# Patient Record
Sex: Female | Born: 1975 | Race: White | Hispanic: No | Marital: Single | Smoking: Current every day smoker
Health system: Southern US, Community
[De-identification: ages and names within clinical notes are randomized; demographics above are authoritative.]

## PROBLEM LIST (undated history)

## (undated) HISTORY — PX: TONSILLECTOMY: SUR1361

---

## 2000-06-14 ENCOUNTER — Emergency Department (HOSPITAL_COMMUNITY): Admission: EM | Admit: 2000-06-14 | Discharge: 2000-06-14 | Payer: Self-pay | Admitting: Internal Medicine

## 2008-11-08 ENCOUNTER — Ambulatory Visit: Payer: Self-pay

## 2010-06-14 ENCOUNTER — Ambulatory Visit: Payer: Self-pay | Admitting: Family

## 2010-06-27 ENCOUNTER — Ambulatory Visit: Payer: Self-pay | Admitting: Family

## 2010-08-18 IMAGING — CR DG LUMBAR SPINE AP/LAT/OBLIQUES W/ FLEX AND EXT
1 series · 5 of 5 positions shown · non-contrast
Comparison: none

REASON FOR EXAM: back pain Dr. Yerovi Bosiga fax [phone]
COMMENTS:

[Series 1: view not recorded · 0.17mm/px · 5 of 5 slices shown]
[im 1/5]
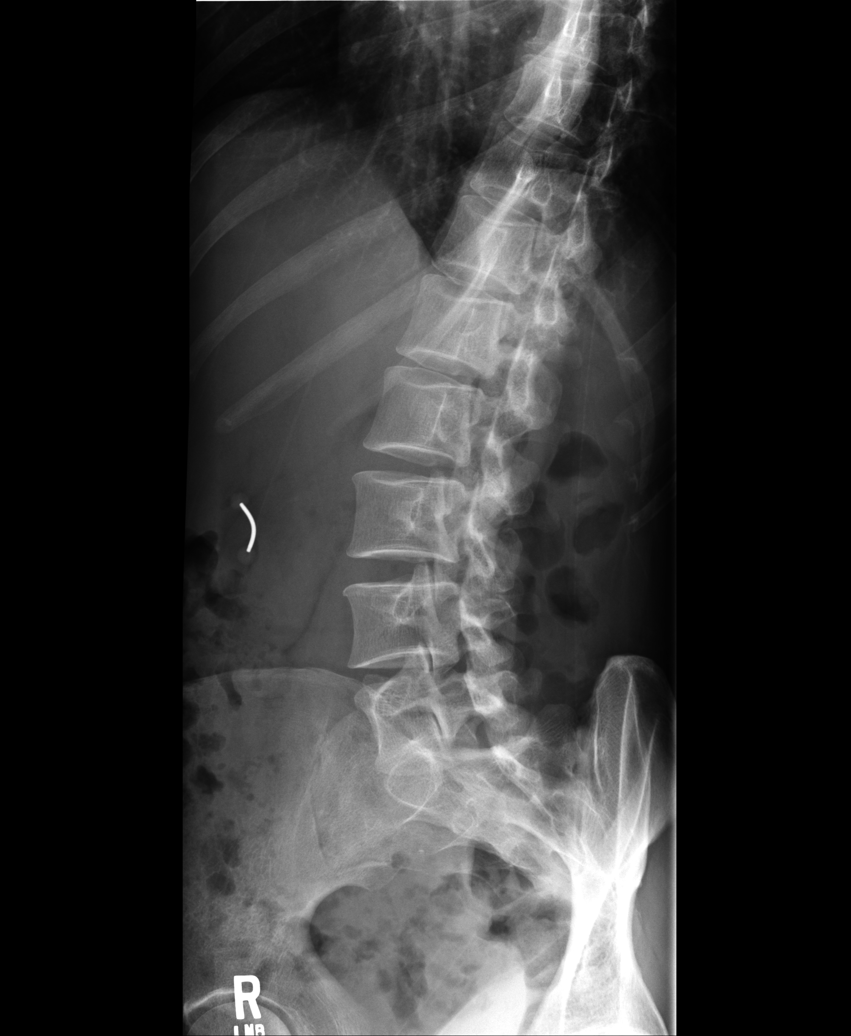
[im 2/5]
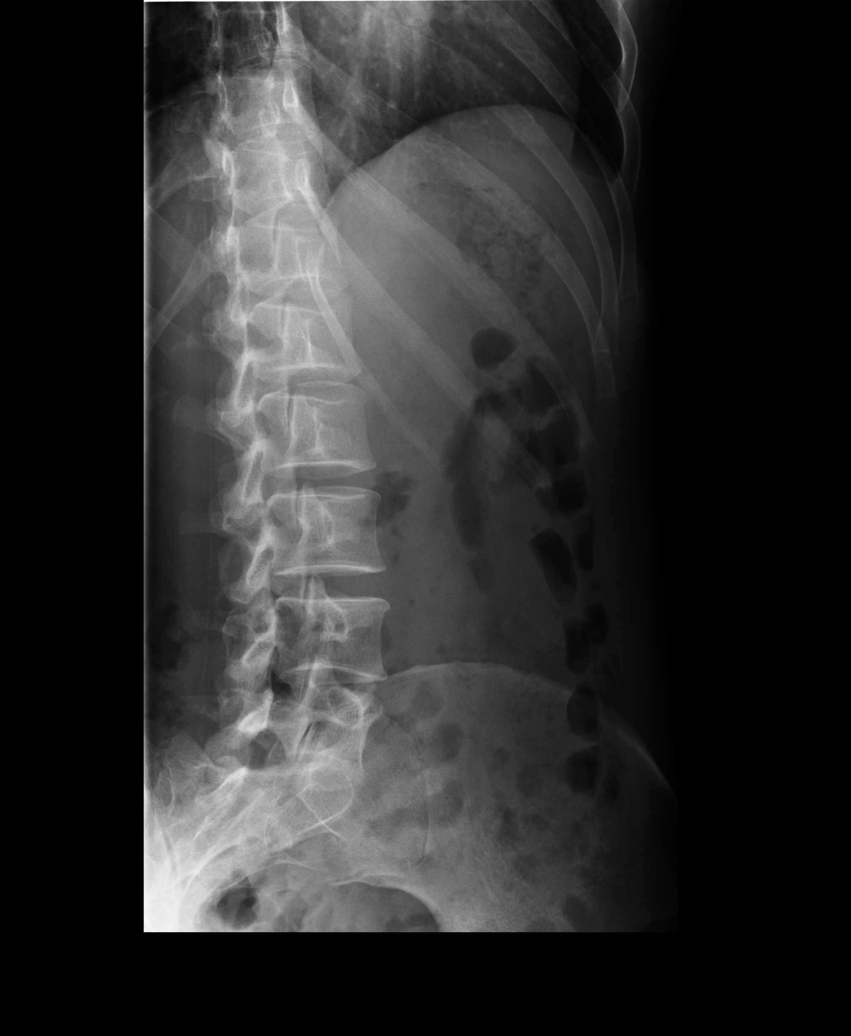
[im 3/5]
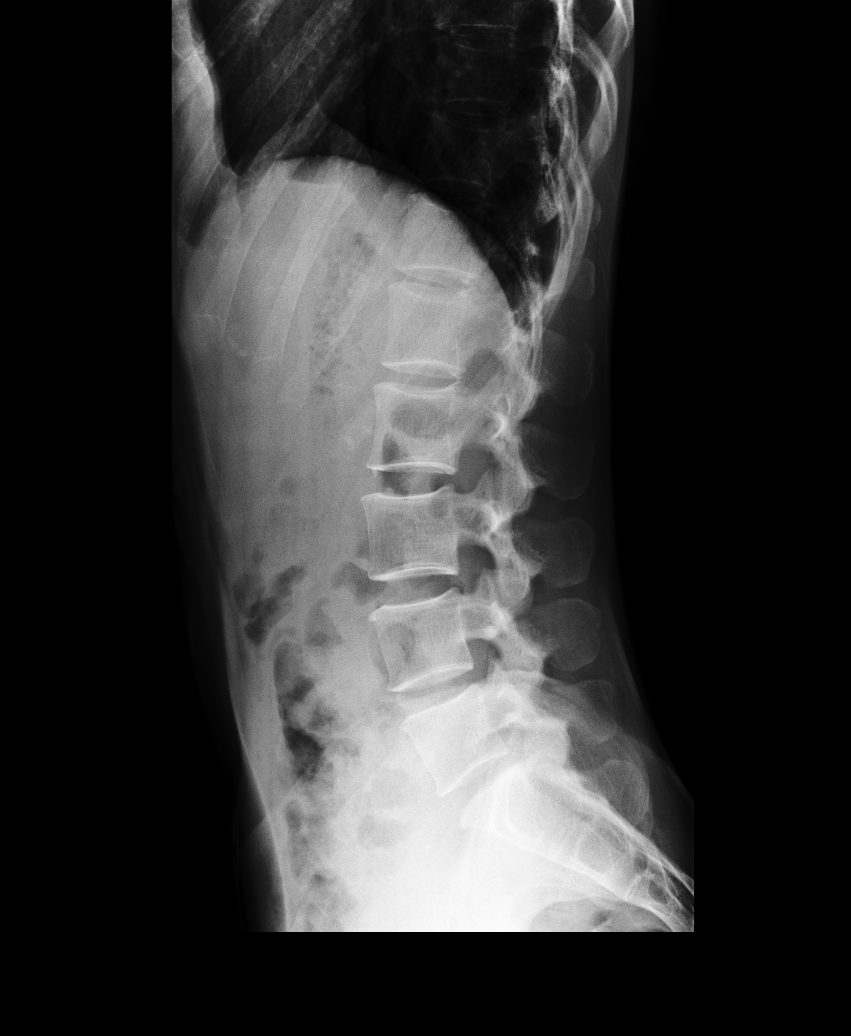
[im 4/5]
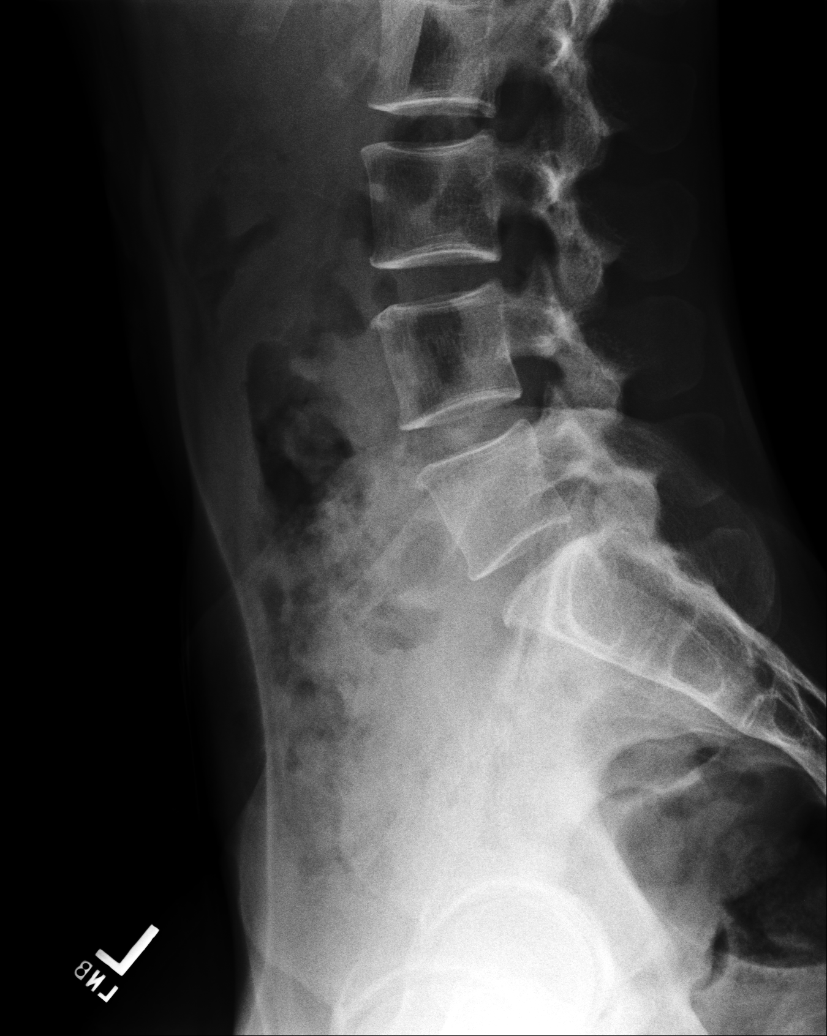
[im 5/5]
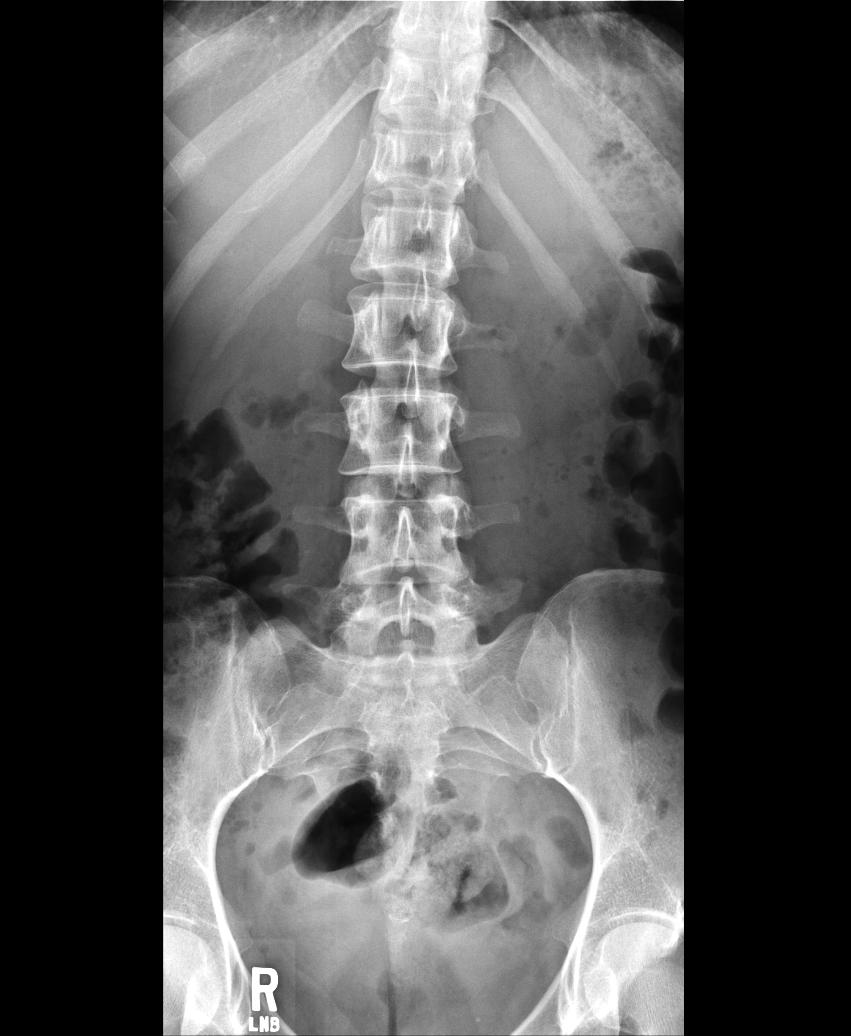

[5 of 5 positions shown; findings below may reference images not displayed]

PROCEDURE:     DXR - DXR LUMBAR SPINE WITH OBLIQUES  - November 08, 2008  [DATE]

RESULT:     The lumbar vertebral bodies are preserved in height. The
intervertebral disc space heights are well maintained. The posterior
elements are intact. Very gentle curvature of the thoracolumbar spine is
present which may be positional. The observed portions of the sacrum are
normal. I see no pars defect nor spondylolisthesis.
IMPRESSION: I see no acute bony abnormality of the lumbar spine.

## 2012-03-23 IMAGING — US TRANSABDOMINAL ULTRASOUND OF PELVIS
1 series · 17 of 25 positions shown · non-contrast
Comparison: none

REASON FOR EXAM: persistent left lower pelvic pain for past 6 months
COMMENTS:

[Series 1: transabdominal ultrasound of pelvis · 17 of 81 slices shown]
[im 1/81]
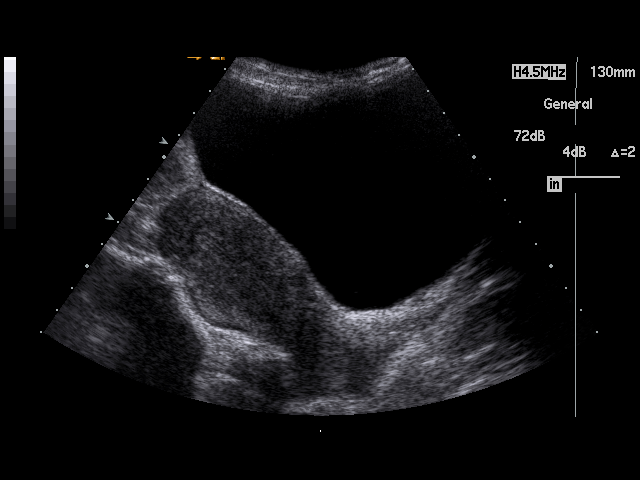
[im 7/81]
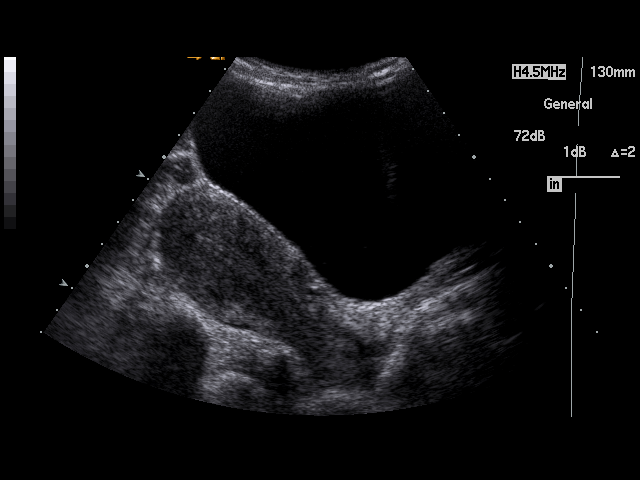
[im 11/81]
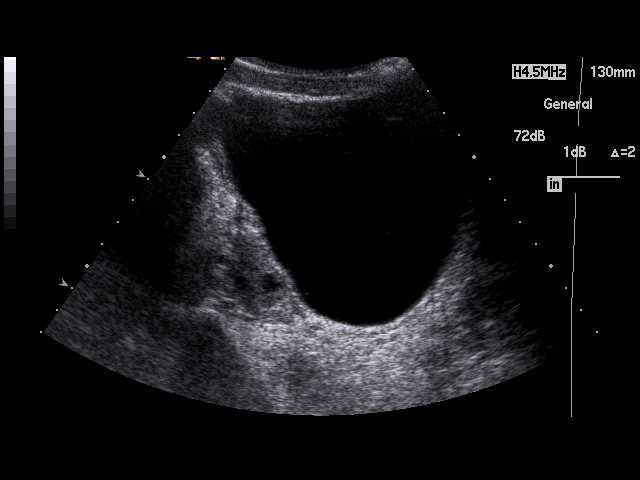
[im 17/81]
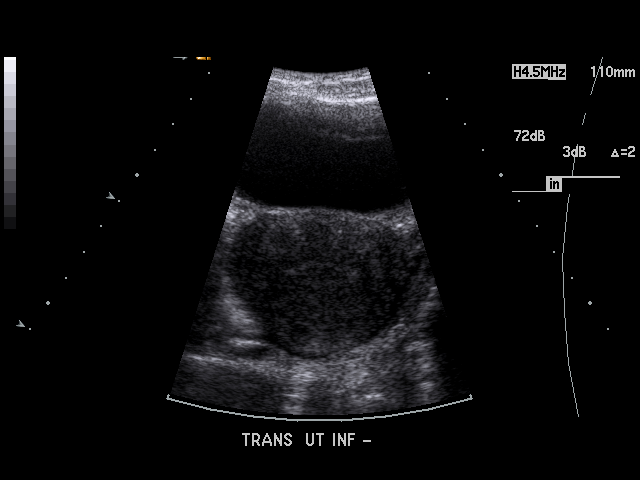
[im 21/81]
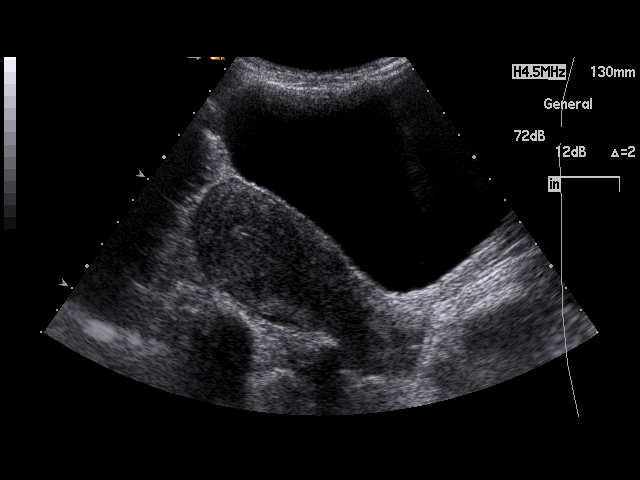
[im 27/81]
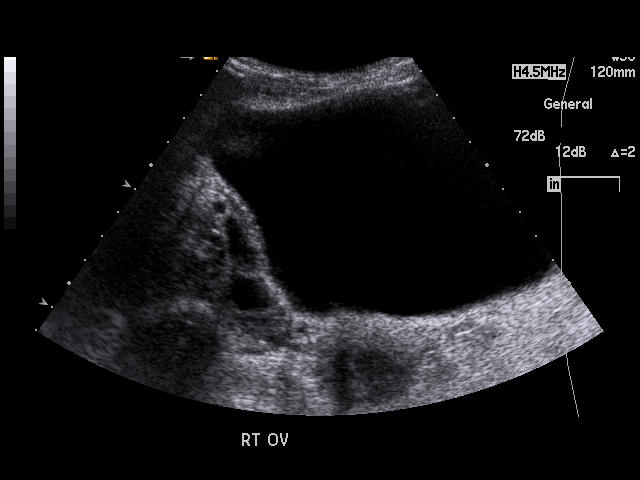
[im 31/81]
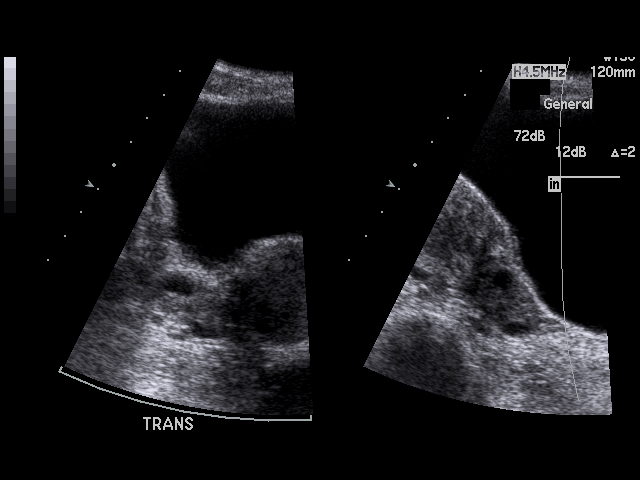
[im 37/81]
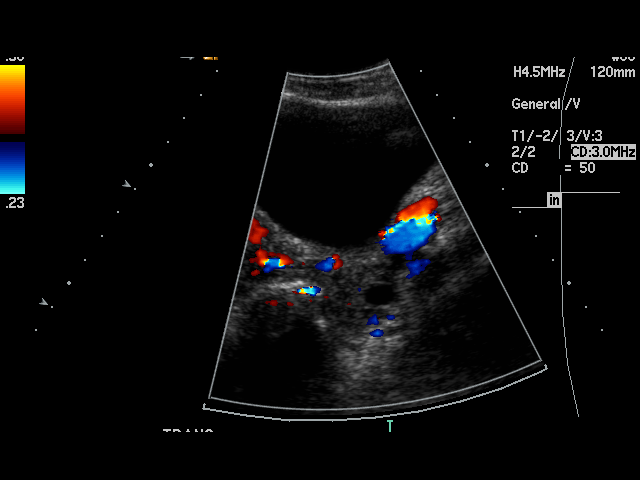
[im 41/81]
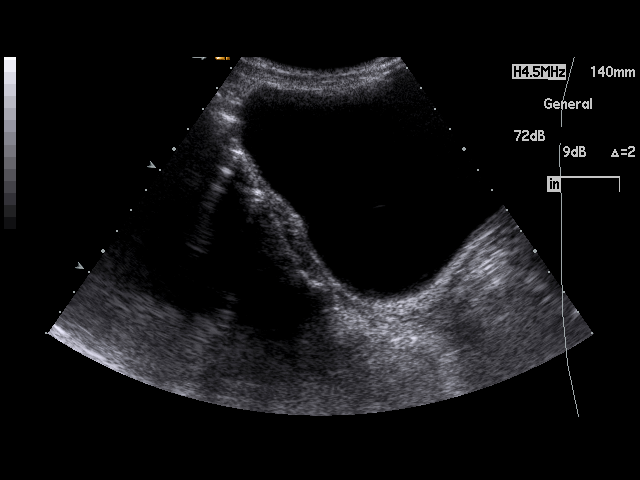
[im 44/81]
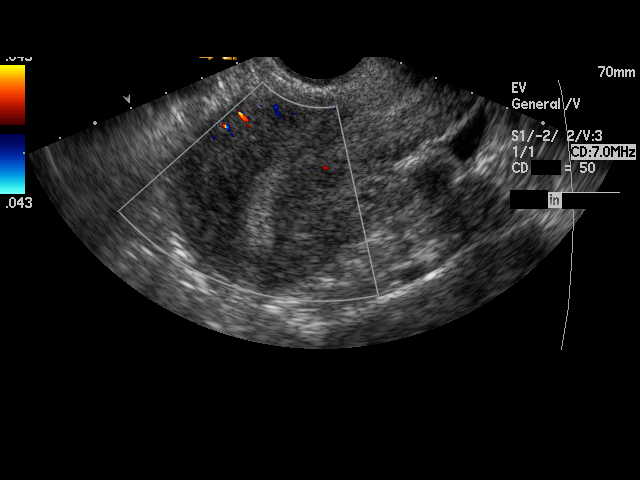
[im 51/81]
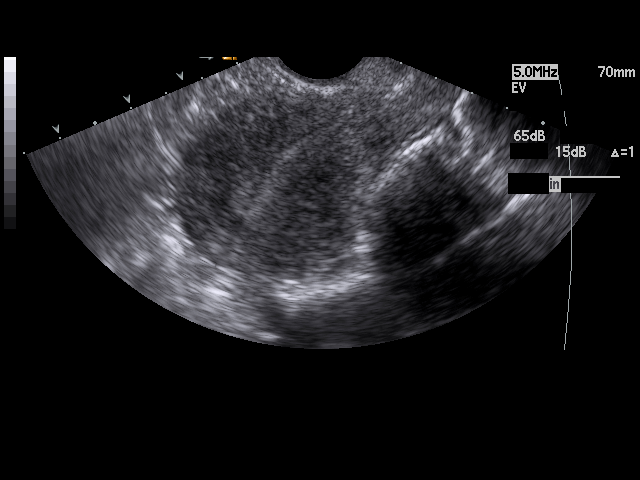
[im 54/81]
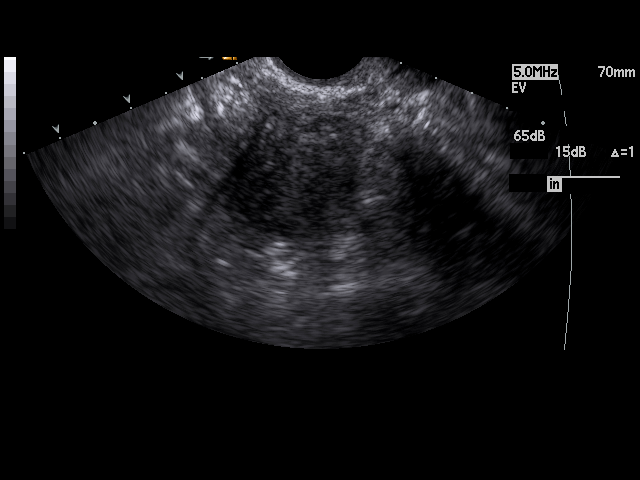
[im 61/81]
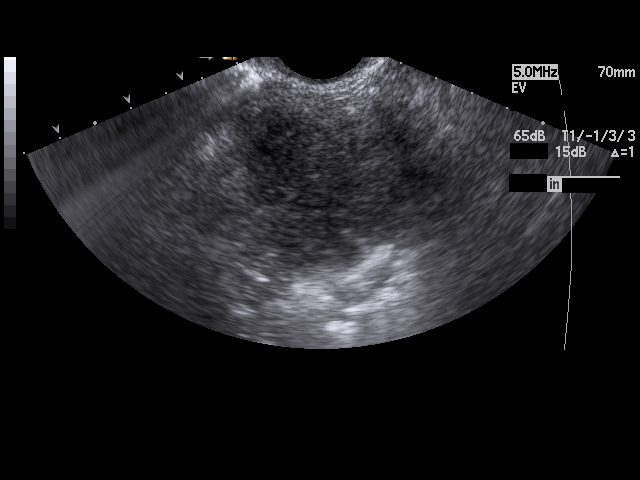
[im 64/81]
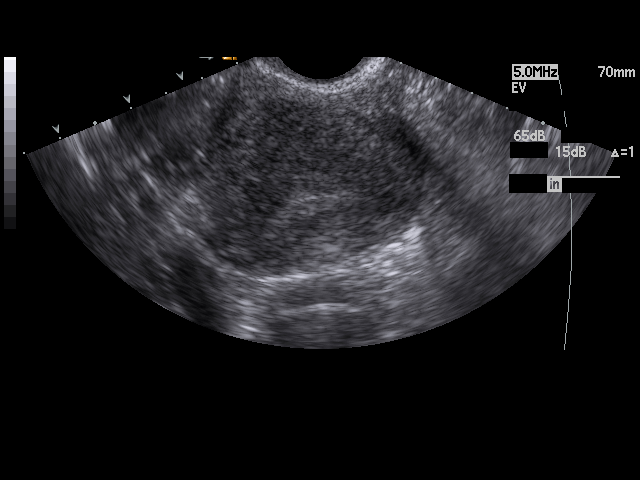
[im 71/81]
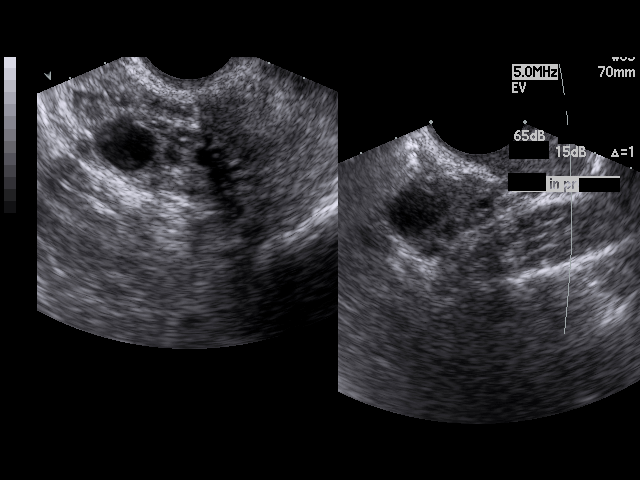
[im 74/81]
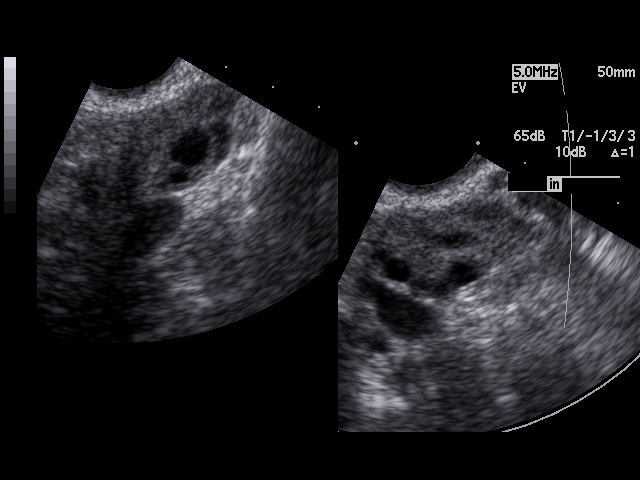
[im 81/81]
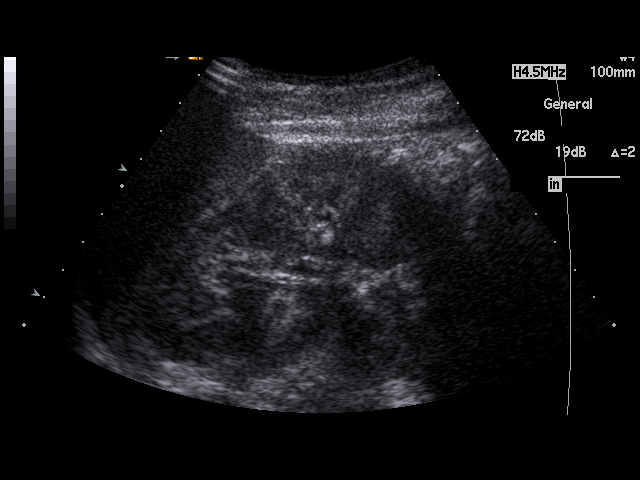

[17 of 25 positions shown; findings below may reference images not displayed]

PROCEDURE:     ACKEINE - ACKEINE PELVIS NON-OB W/TRANSVAGINAL  - June 14, 2010  [DATE]

RESULT:

Transabdominal and endovaginal imaging of the pelvis was obtained.

The uterus demonstrates a homogeneous echotexture and measures 9.8 x 5.2 x
5.6 cm with endometrial thickness of 7.3 mm. A trace amount of fluid is
identified within the cul-de-sac. The right ovary measures 3.25 x 1.73 x
cm and the left 2.91 x 1.95 x 1.74 cm. Small follicles are appreciated
within the right and left ovaries. A dominant follicle versus a small cyst
is appreciated within the right ovary. Color flow is appreciated within the
ovaries. The urinary bladder is distended. There is no evidence of
hydronephrosis.
IMPRESSION: 1. Unremarkable pelvic ultrasound as described above. A trace amount of
fluid is appreciated within the cul-de-sac likely physiologic.

## 2012-04-05 IMAGING — CR DG CHEST 2V
1 series · 2 of 2 positions shown · non-contrast
Comparison: none

REASON FOR EXAM: chest pain dyspnea
COMMENTS:

[Series 1: view not recorded · 0.17mm/px · 2 of 2 slices shown]
[im 1/2]
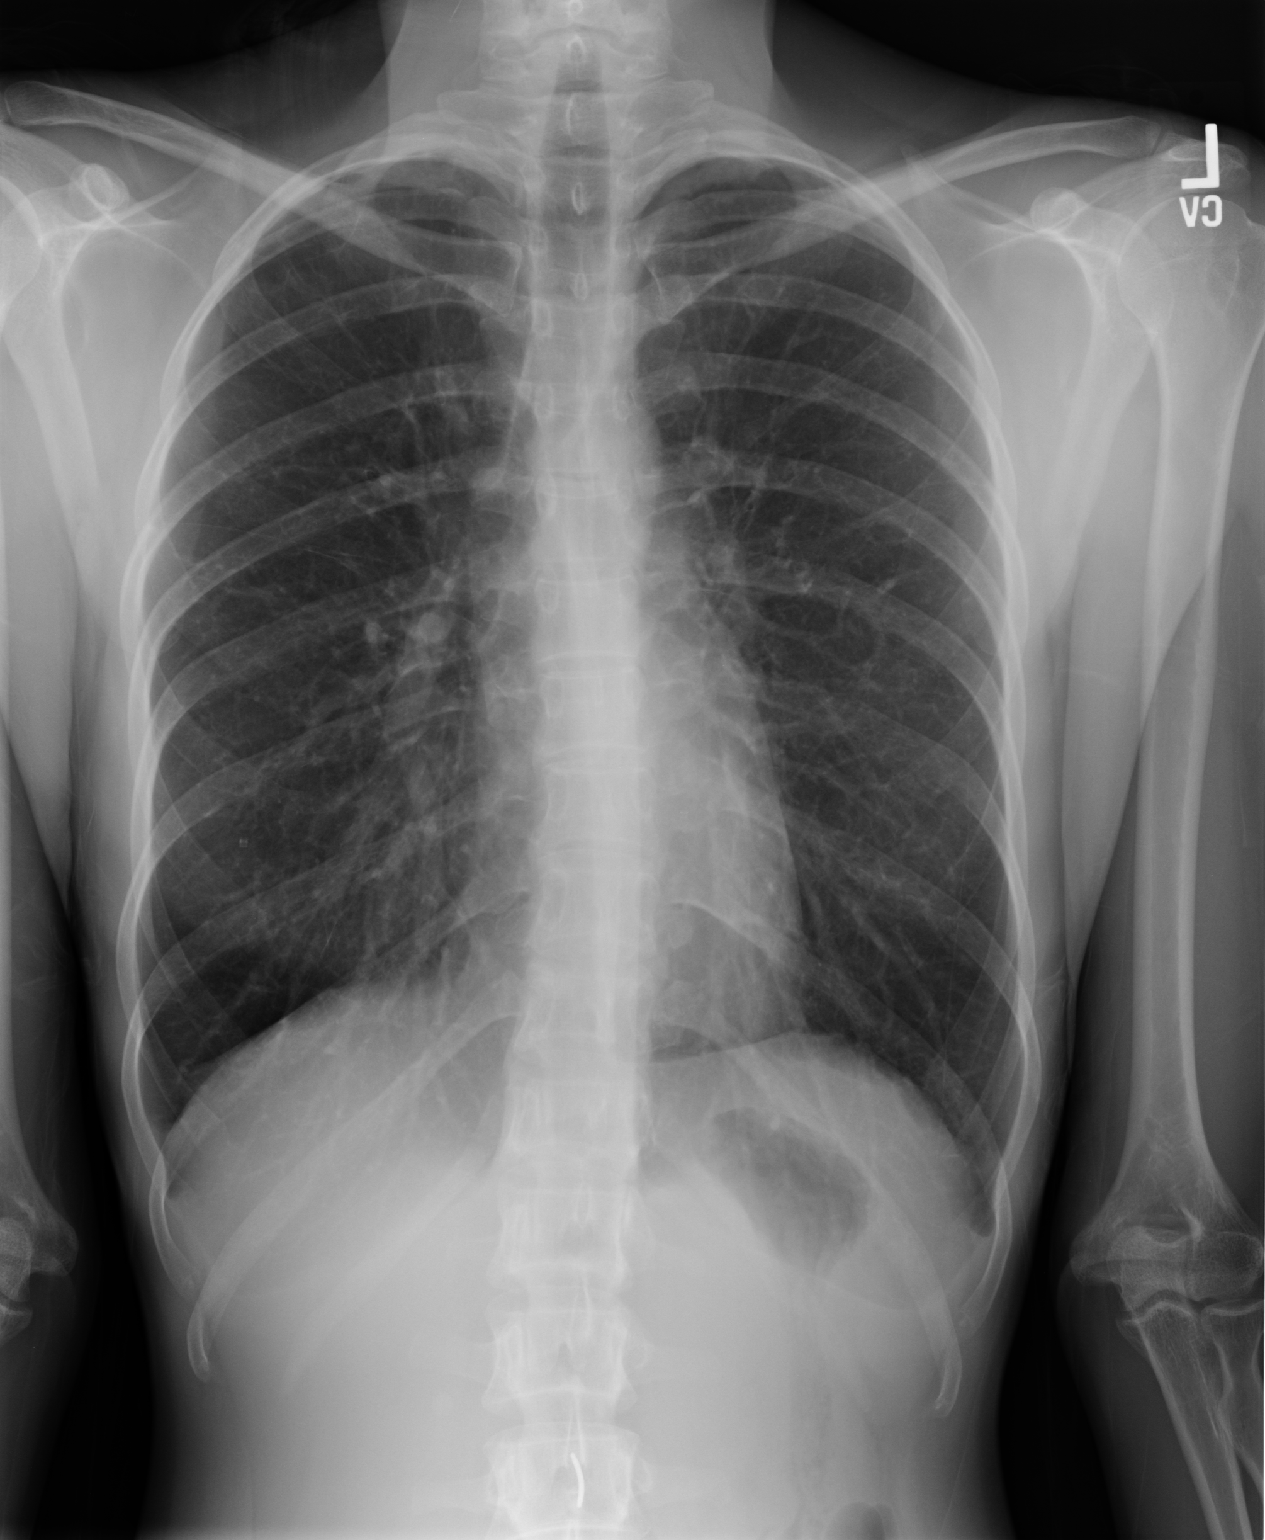
[im 2/2]
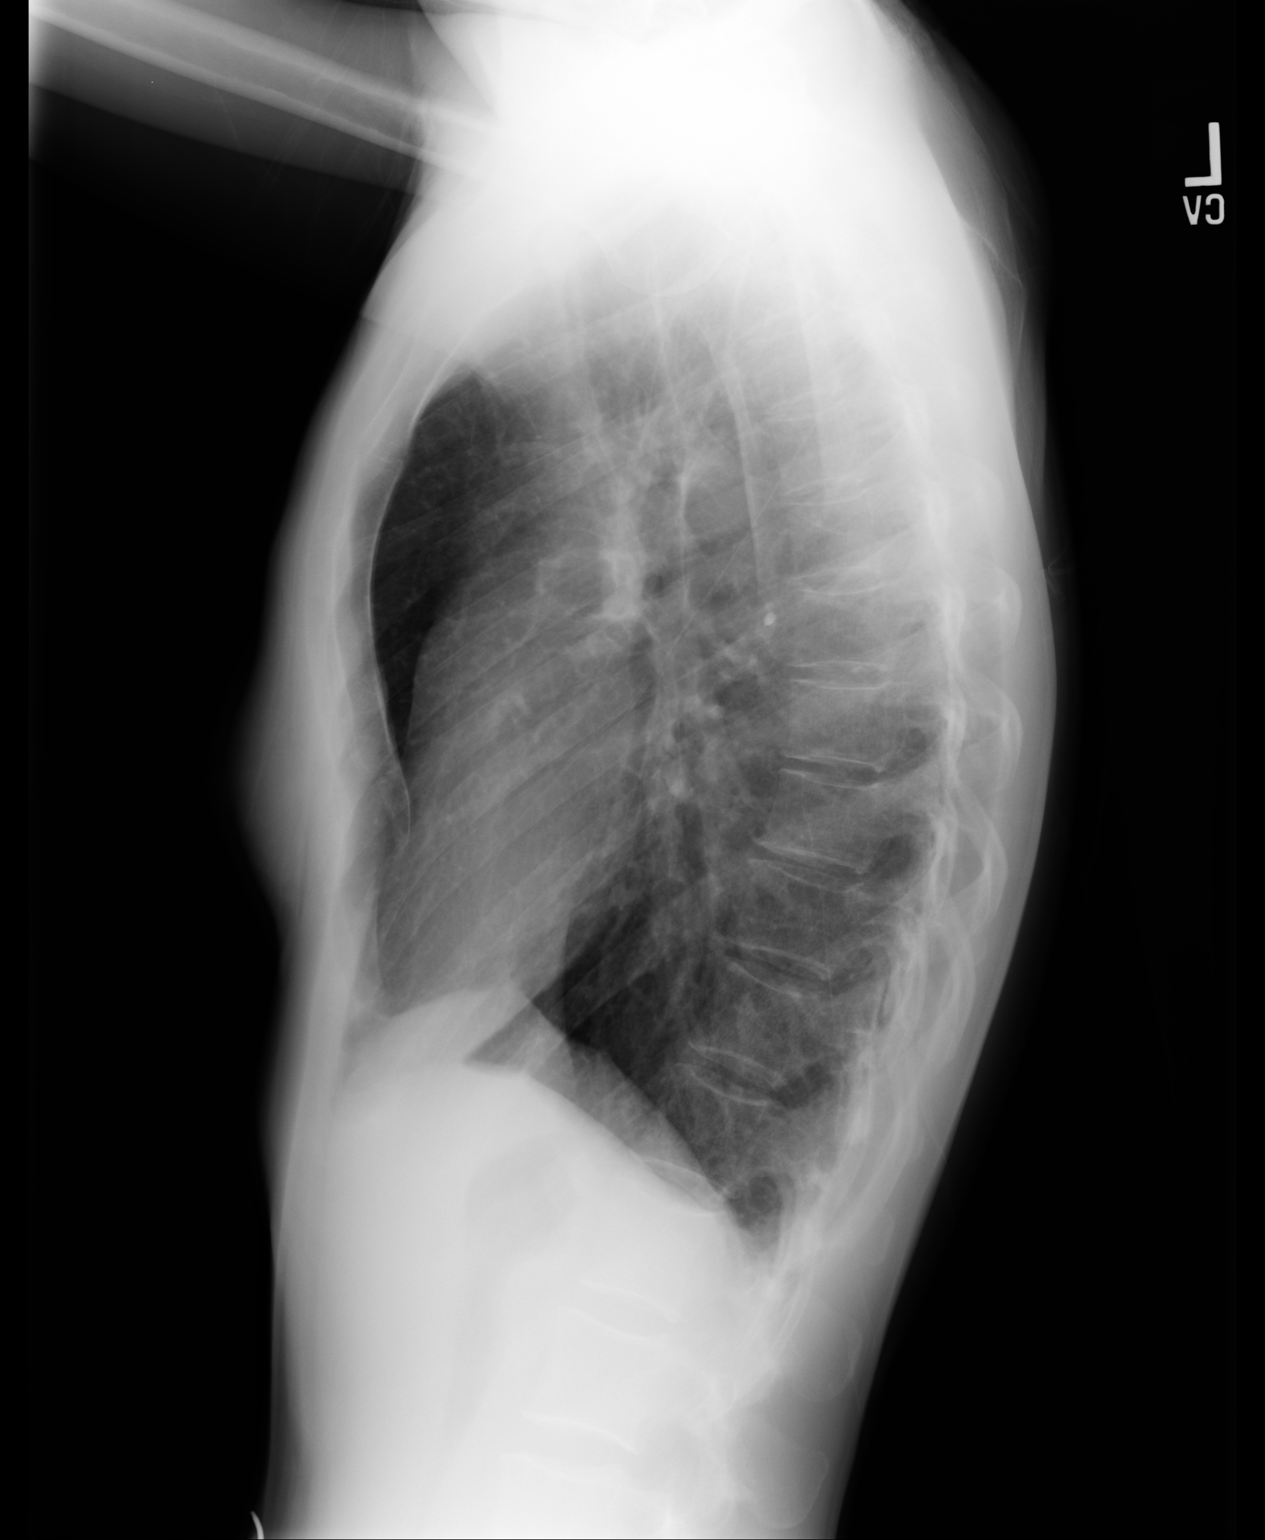

[2 of 2 positions shown; findings below may reference images not displayed]

PROCEDURE:     KDR - KDXR CHEST PA (OR AP) AND LAT  - June 27, 2010 [DATE]

RESULT:     The chest appears bilaterally hyperinflated which suggest a
history of COPD or asthma. The lung fields are clear. No pneumonia,
pneumothorax or pleural effusion is seen. Heart size is normal. No acute
bony abnormalities are seen.
IMPRESSION: 1. The lung fields are clear.
2. The chest appears bilaterally hyperinflated.

## 2012-10-24 ENCOUNTER — Emergency Department: Payer: Self-pay | Admitting: Emergency Medicine

## 2015-04-08 ENCOUNTER — Emergency Department: Payer: Self-pay

## 2015-04-08 ENCOUNTER — Encounter: Payer: Self-pay | Admitting: Emergency Medicine

## 2015-04-08 ENCOUNTER — Emergency Department
Admission: EM | Admit: 2015-04-08 | Discharge: 2015-04-08 | Disposition: A | Discharge status: Home / Self Care | Payer: Self-pay | Attending: Emergency Medicine | Admitting: Emergency Medicine

## 2015-04-08 DIAGNOSIS — R1111 Vomiting without nausea: Secondary | ICD-10-CM | POA: Insufficient documentation

## 2015-04-08 DIAGNOSIS — R1011 Right upper quadrant pain: Secondary | ICD-10-CM | POA: Insufficient documentation

## 2015-04-08 DIAGNOSIS — F17210 Nicotine dependence, cigarettes, uncomplicated: Secondary | ICD-10-CM | POA: Insufficient documentation

## 2015-04-08 DIAGNOSIS — Z3202 Encounter for pregnancy test, result negative: Secondary | ICD-10-CM | POA: Insufficient documentation

## 2015-04-08 DIAGNOSIS — R1032 Left lower quadrant pain: Secondary | ICD-10-CM | POA: Insufficient documentation

## 2015-04-08 LAB — URINALYSIS COMPLETE WITH MICROSCOPIC (ARMC ONLY)
Bilirubin Urine: NEGATIVE
Glucose, UA: NEGATIVE mg/dL
Ketones, ur: NEGATIVE mg/dL
Leukocytes, UA: NEGATIVE
NITRITE: NEGATIVE
PROTEIN: NEGATIVE mg/dL
SPECIFIC GRAVITY, URINE: 1.002 — AB (ref 1.005–1.030)
pH: 6 (ref 5.0–8.0)

## 2015-04-08 LAB — CBC
HCT: 43.7 % (ref 35.0–47.0)
HEMOGLOBIN: 14.8 g/dL (ref 12.0–16.0)
MCH: 30.6 pg (ref 26.0–34.0)
MCHC: 33.9 g/dL (ref 32.0–36.0)
MCV: 90.1 fL (ref 80.0–100.0)
Platelets: 231 10*3/uL (ref 150–440)
RBC: 4.85 MIL/uL (ref 3.80–5.20)
RDW: 12.6 % (ref 11.5–14.5)
WBC: 9.4 10*3/uL (ref 3.6–11.0)

## 2015-04-08 LAB — COMPREHENSIVE METABOLIC PANEL
ALK PHOS: 56 U/L (ref 38–126)
ALT: 9 U/L — ABNORMAL LOW (ref 14–54)
ANION GAP: 5 (ref 5–15)
AST: 12 U/L — ABNORMAL LOW (ref 15–41)
Albumin: 4.6 g/dL (ref 3.5–5.0)
BILIRUBIN TOTAL: 0.6 mg/dL (ref 0.3–1.2)
BUN: <5 mg/dL — ABNORMAL LOW (ref 6–20)
CALCIUM: 9.4 mg/dL (ref 8.9–10.3)
CO2: 29 mmol/L (ref 22–32)
Chloride: 103 mmol/L (ref 101–111)
Creatinine, Ser: 0.65 mg/dL (ref 0.44–1.00)
Glucose, Bld: 108 mg/dL — ABNORMAL HIGH (ref 65–99)
Potassium: 3.3 mmol/L — ABNORMAL LOW (ref 3.5–5.1)
SODIUM: 137 mmol/L (ref 135–145)
TOTAL PROTEIN: 7 g/dL (ref 6.5–8.1)

## 2015-04-08 LAB — LIPASE, BLOOD: Lipase: 24 U/L (ref 11–51)

## 2015-04-08 LAB — POCT PREGNANCY, URINE: PREG TEST UR: NEGATIVE

## 2015-04-08 MED ORDER — GI COCKTAIL ~~LOC~~
30.0000 mL | ORAL | Status: AC
  Administered 2015-04-08: 30 mL via ORAL

## 2015-04-08 MED ORDER — GI COCKTAIL ~~LOC~~
ORAL | Status: AC
  Administered 2015-04-08: 30 mL via ORAL
  Filled 2015-04-08: qty 30

## 2015-04-08 MED ORDER — RANITIDINE HCL 150 MG PO CAPS: 150.0000 mg | ORAL_CAPSULE | Freq: Two times a day (BID) | ORAL | Status: AC

## 2015-04-08 MED ORDER — SUCRALFATE 1 G PO TABS
1.0000 g | ORAL_TABLET | Freq: Four times a day (QID) | ORAL | Status: AC
Start: 2015-04-08 — End: ?

## 2015-04-08 MED ORDER — METOCLOPRAMIDE HCL 10 MG PO TABS
10.0000 mg | ORAL_TABLET | Freq: Three times a day (TID) | ORAL | Status: AC
Start: 2015-04-08 — End: ?

## 2015-04-08 NOTE — ED Notes (Signed)
Discussed discharge instructions, prescriptions, and follow-up care with patient. No questions or concerns at this time. Pt stable at discharge.  

## 2015-04-08 NOTE — ED Notes (Signed)
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 Pt given hard copy of work by MD. Pt may go back to work Monday 04/10/15.

## 2015-04-08 NOTE — ED Provider Notes (Signed)
[facility] Emergency Department Provider Note  ____________________________________________  Time seen: 7:20 PM  I have reviewed the triage vital signs and the nursing notes.   HISTORY  Chief Complaint Emesis    HPI Sara Ryan is a 40 y.o. female who complains of vomiting and loose bowel movements after eating for the past month. No fevers and no nausea. She denies any significant pain as well. No trauma. No urinary symptoms. No chest pain shortness of breath or fever.  No change in stool color or quality   History reviewed. No pertinent past medical history.   There are no active problems to display for this patient.    History reviewed. No pertinent past surgical history. C-section Tonsillectomy  Current Outpatient Rx  Name  Route  Sig  Dispense  Refill  . metoCLOPramide (REGLAN) 10 MG tablet   Oral   Take 1 tablet (10 mg total) by mouth 4 (four) times daily -  before meals and at bedtime.   60 tablet   0   . ranitidine (ZANTAC) 150 MG capsule   Oral   Take 1 capsule (150 mg total) by mouth 2 (two) times daily.   28 capsule   0   . sucralfate (CARAFATE) 1 g tablet   Oral   Take 1 tablet (1 g total) by mouth 4 (four) times daily.   120 tablet   1      Allergies Codeine   No family history on file.  Social History Social History  Substance Use Topics  . Smoking status: Current Every Day Smoker -- 1.00 packs/day    Types: Cigarettes  . Smokeless tobacco: None  . Alcohol Use: No    Review of Systems  Constitutional:   No fever or chills. No weight changes Eyes:   No blurry vision or double vision.  ENT:   No sore throat.  Cardiovascular:   No chest pain. Respiratory:   No dyspnea or cough. Gastrointestinal:   Negative for abdominal pain, intermittent vomiting and diarrhea after eating.  No BRBPR or melena. Genitourinary:   Negative for dysuria or difficulty urinating. Musculoskeletal:   Negative for back pain. No  joint swelling or pain. Skin:   Negative for rash. Neurological:   Negative for headaches, focal weakness or numbness. Psychiatric:  No anxiety or depression.   Endocrine:  No changes in energy or sleep difficulty.  10-point ROS otherwise negative.  ____________________________________________   PHYSICAL EXAM:  VITAL SIGNS: ED Triage Vitals  Enc Vitals Group     BP 04/08/15 1653 123/89 mmHg     Pulse Rate 04/08/15 1653 98     Resp 04/08/15 1653 18     Temp 04/08/15 1653 98 F (36.7 C)     Temp Source 04/08/15 1653 Oral     SpO2 04/08/15 1653 99 %     Weight 04/08/15 1653 98 lb (44.453 kg)     Height 04/08/15 1653  (1.626 m)     Head Cir --      Peak Flow --      Pain Score --      Pain Loc --      Pain Edu? --      Excl. in GC? --     Vital signs reviewed, nursing assessments reviewed.   Constitutional:   Alert and oriented. Well appearing and in no distress. Eyes:   No scleral icterus. No conjunctival pallor. PERRL. EOMI ENT   Head:   Normocephalic and  atraumatic.   Nose:   No congestion/rhinnorhea. No septal hematoma   Mouth/Throat:   MMM, no pharyngeal erythema. No peritonsillar mass.    Neck:   No stridor. No SubQ emphysema. No meningismus. Hematological/Lymphatic/Immunilogical:   No cervical lymphadenopathy. Cardiovascular:   RRR. Symmetric bilateral radial and DP pulses.  No murmurs.  Respiratory:   Normal respiratory effort without tachypnea nor retractions. Breath sounds are clear and equal bilaterally. No wheezes/rales/rhonchi. Gastrointestinal:   Soft with mild tenderness in the right upper quadrant and left lower quadrant.. Non distended. There is no CVA tenderness.  No rebound, rigidity, or guarding. Genitourinary:   deferred Musculoskeletal:   Nontender with normal range of motion in all extremities. No joint effusions.  No lower extremity tenderness.  No edema. Neurologic:   Normal speech and language.  CN 2-10 normal. Motor grossly  intact. No gross focal neurologic deficits are appreciated.  Skin:    Skin is warm, dry and intact. No rash noted.  No petechiae, purpura, or bullae. Psychiatric:   Mood and affect are normal. No SI or hallucinations ____________________________________________    LABS (pertinent positives/negatives) (all labs ordered are listed, but only abnormal results are displayed) Labs Reviewed  COMPREHENSIVE METABOLIC PANEL - Abnormal; Notable for the following:    Potassium 3.3 (*)    Glucose, Bld 108 (*)    BUN <5 (*)    AST 12 (*)    ALT 9 (*)    All other components within normal limits  URINALYSIS COMPLETEWITH MICROSCOPIC ([facility] ONLY) - Abnormal; Notable for the following:    Color, Urine STRAW (*)    APPearance HAZY (*)    Specific Gravity, Urine 1.002 (*)    Hgb urine dipstick 2+ (*)    Bacteria, UA RARE (*)    Squamous Epithelial / LPF 0-5 (*)    All other components within normal limits  LIPASE, BLOOD  CBC  POC URINE PREG, ED  POCT PREGNANCY, URINE   ____________________________________________   EKG    ____________________________________________    RADIOLOGY  Ultrasound right upper quadrant shows some gallbladder sludge but no gallstones no evidence of cholecystitis.  ____________________________________________   PROCEDURES   ____________________________________________   INITIAL IMPRESSION / ASSESSMENT AND PLAN / ED COURSE  Pertinent labs & imaging results that were available during my care of the patient were reviewed by me and considered in my medical decision making (see chart for details).  Patient presents with chronic GI complaints related to eating. Able to eat in the waiting room with soda and pretzels. No evidence of dehydration on labs or exam. Ultrasound performed due to right upper quadrant tenderness for possible impacted gallstone that this is negative. Very low suspicion at this point for cholangitis or cholecystitis. We'll discharge home to  follow up with primary care. We'll start empirically on an antacid regimen as a trial.     ____________________________________________   FINAL CLINICAL IMPRESSION(S) / ED DIAGNOSES  Final diagnoses:  Vomiting without nausea, vomiting of unspecified type      Sharman Cheek, MD 04/08/15 2108

## 2015-04-08 NOTE — ED Notes (Signed)
Pt to treatment room 14, ambulatory with steady gait; noted to have soda and pretzels in her hands; pt says she's been trying to eat; asked pt to refrain from eating until cleared by MD; verbalized understanding

## 2015-04-08 NOTE — ED Notes (Signed)
Intermittent nausea with vomiting and diarrhea x 1 month. Denies fevers. Denies dysuria.

## 2015-04-08 NOTE — Discharge Instructions (Signed)
The ultrasound does not show any gallstones. Follow-up with primary care for continued monitoring of your symptoms. Take an antacid medication to attempt to control the symptoms.  Nausea and Vomiting Nausea is a sick feeling that often comes before throwing up (vomiting). Vomiting is a reflex where stomach contents come out of your mouth. Vomiting can cause severe loss of body fluids (dehydration). Children and elderly adults can become dehydrated quickly, especially if they also have diarrhea. Nausea and vomiting are symptoms of a condition or disease. It is important to find the cause of your symptoms. CAUSES   Direct irritation of the stomach lining. This irritation can result from increased acid production (gastroesophageal reflux disease), infection, food poisoning, taking certain medicines (such as nonsteroidal anti-inflammatory drugs), alcohol use, or tobacco use.  Signals from the brain.These signals could be caused by a headache, heat exposure, an inner ear disturbance, increased pressure in the brain from injury, infection, a tumor, or a concussion, pain, emotional stimulus, or metabolic problems.  An obstruction in the gastrointestinal tract (bowel obstruction).  Illnesses such as diabetes, hepatitis, gallbladder problems, appendicitis, kidney problems, cancer, sepsis, atypical symptoms of a heart attack, or eating disorders.  Medical treatments such as chemotherapy and radiation.  Receiving medicine that makes you sleep (general anesthetic) during surgery. DIAGNOSIS Your caregiver may ask for tests to be done if the problems do not improve after a few days. Tests may also be done if symptoms are severe or if the reason for the nausea and vomiting is not clear. Tests may include:  Urine tests.  Blood tests.  Stool tests.  Cultures (to look for evidence of infection).  X-rays or other imaging studies. Test results can help your caregiver make decisions about treatment or the  need for additional tests. TREATMENT You need to stay well hydrated. Drink frequently but in small amounts.You may wish to drink water, sports drinks, clear broth, or eat frozen ice pops or gelatin dessert to help stay hydrated.When you eat, eating slowly may help prevent nausea.There are also some antinausea medicines that may help prevent nausea. HOME CARE INSTRUCTIONS   Take all medicine as directed by your caregiver.  If you do not have an appetite, do not force yourself to eat. However, you must continue to drink fluids.  If you have an appetite, eat a normal diet unless your caregiver tells you differently.  Eat a variety of complex carbohydrates (rice, wheat, potatoes, bread), lean meats, yogurt, fruits, and vegetables.  Avoid high-fat foods because they are more difficult to digest.  Drink enough water and fluids to keep your urine clear or pale yellow.  If you are dehydrated, ask your caregiver for specific rehydration instructions. Signs of dehydration may include:  Severe thirst.  Dry lips and mouth.  Dizziness.  Dark urine.  Decreasing urine frequency and amount.  Confusion.  Rapid breathing or pulse. SEEK IMMEDIATE MEDICAL CARE IF:   You have blood or brown flecks (like coffee grounds) in your vomit.  You have black or bloody stools.  You have a severe headache or stiff neck.  You are confused.  You have severe abdominal pain.  You have chest pain or trouble breathing.  You do not urinate at least once every 8 hours.  You develop cold or clammy skin.  You continue to vomit for longer than 24 to 48 hours.  You have a fever. MAKE SURE YOU:   Understand these instructions.  Will watch your condition.  Will get help right away if  you are not doing well or get worse.   This information is not intended to replace advice given to you by your health care provider. Make sure you discuss any questions you have with your health care provider.     Document Released: 02/04/2005 Document Revised: 04/29/2011 Document Reviewed: 07/04/2010 Elsevier Interactive Patient Education [employer]

## 2015-06-18 ENCOUNTER — Encounter: Payer: Self-pay | Admitting: Emergency Medicine

## 2015-06-18 ENCOUNTER — Emergency Department
Admission: EM | Admit: 2015-06-18 | Discharge: 2015-06-18 | Disposition: A | Discharge status: Home / Self Care | Payer: Self-pay | Attending: Emergency Medicine | Admitting: Emergency Medicine

## 2015-06-18 DIAGNOSIS — N6320 Unspecified lump in the left breast, unspecified quadrant: Secondary | ICD-10-CM

## 2015-06-18 DIAGNOSIS — Z79899 Other long term (current) drug therapy: Secondary | ICD-10-CM | POA: Insufficient documentation

## 2015-06-18 DIAGNOSIS — F17210 Nicotine dependence, cigarettes, uncomplicated: Secondary | ICD-10-CM | POA: Insufficient documentation

## 2015-06-18 DIAGNOSIS — N63 Unspecified lump in breast: Secondary | ICD-10-CM | POA: Insufficient documentation

## 2015-06-18 NOTE — ED Provider Notes (Signed)
[facility] Emergency Department Provider Note  ____________________________________________  Time seen: Approximately 9:52 PM  I have reviewed the triage vital signs and the nursing notes.   HISTORY  Chief Complaint Abscess    HPI Sara Ryan L Kratky is a 40 y.o. female who presents to emergency department complaining of a painful left sided breast mass. Patient states that she first noticed it yesterday with some discomfort. This morning when she was in the shower was grabbing herself she felt exquisite pain to the area. Patient states that pain has continued. No pain at rest but any palpation results in severe pain. Patient has not take any medications prior to arrival. Patient denies any history of family breast cancer or cancer in herself. Patient states that she does not have a primary care provider or OB/GYN and does not regularly have mammograms. Patient is currently starting her period.Patient denies any visualization of mass to the breast, erythema or edema on the skin of the breast, or drainage from breast tissue or nipple.   History reviewed. No pertinent past medical history.  There are no active problems to display for this patient.   Past Surgical History  Procedure Laterality Date  . Cesarean section    . Tonsillectomy      Current Outpatient Rx  Name  Route  Sig  Dispense  Refill  . metoCLOPramide (REGLAN) 10 MG tablet   Oral   Take 1 tablet (10 mg total) by mouth 4 (four) times daily -  before meals and at bedtime.   60 tablet   0   . ranitidine (ZANTAC) 150 MG capsule   Oral   Take 1 capsule (150 mg total) by mouth 2 (two) times daily.   28 capsule   0   . sucralfate (CARAFATE) 1 g tablet   Oral   Take 1 tablet (1 g total) by mouth 4 (four) times daily.   120 tablet   1     Allergies Codeine  History reviewed. No pertinent family history.  Social History Social History  Substance Use Topics  . Smoking status: Current  Every Day Smoker -- 1.00 packs/day    Types: Cigarettes  . Smokeless tobacco: None  . Alcohol Use: No     Review of Systems  Constitutional: No fever/chills Eyes: No visual changes. No discharge ENT: No upper respiratory complaints. Cardiovascular: no chest pain. Respiratory: no cough. No SOB. Musculoskeletal: Negative for musculoskeletal pain. Skin: Negative for rash, abrasions, lacerations, ecchymosis.Positive for left-sided breast lesion. Neurological: Negative for headaches, focal weakness or numbness. 10-point ROS otherwise negative.  ____________________________________________   PHYSICAL EXAM:  VITAL SIGNS: ED Triage Vitals  Enc Vitals Group     BP 06/18/15 2100 126/89 mmHg     Pulse Rate 06/18/15 2100 84     Resp 06/18/15 2100 20     Temp 06/18/15 2100 98.3 F (36.8 C)     Temp Source 06/18/15 2100 Oral     SpO2 06/18/15 2100 100 %     Weight 06/18/15 2100 98 lb (44.453 kg)     Height 06/18/15 2100 5\' 4"  (1.626 m)     Head Cir --      Peak Flow --      Pain Score 06/18/15 2101 9     Pain Loc --      Pain Edu? --      Excl. in GC? --      Constitutional: Alert and oriented. Well appearing and in no acute distress.  Eyes: Conjunctivae are normal. PERRL. EOMI. Head: Atraumatic. ENT:      Ears:       Nose: No congestion/rhinnorhea.      Mouth/Throat: Mucous membranes are moist.  Neck: No stridor.   Hematological/Lymphatic/Immunilogical: No cervical lymphadenopathy or axillary lymphadenopathy. Cardiovascular: Normal rate, regular rhythm. Normal S1 and S2.  Good peripheral circulation. Respiratory: Normal respiratory effort without tachypnea or retractions. Lungs CTAB. Good air entry to the bases with no decreased or absent breath sounds. Breast: No visible lesions or deformities noted to bilateral breast. Examination of the right breast is totally unremarkable. Exam of the left breast reveals no erythema or edema to the surface of the skin. No drainage is  noted. Palpation of the breast reveals a well-circumscribed, mobile, tender lesion in the 2:00 position on the left breast. No other masses or lesions are palpated. No drainage is expressed from the nipple. No lymphadenopathy the axillary or cervical region. Musculoskeletal: Full range of motion to all extremities. No gross deformities appreciated. Neurologic:  Normal speech and language. No gross focal neurologic deficits are appreciated.  Skin:  Skin is warm, dry and intact. No rash noted. Psychiatric: Mood and affect are normal. Speech and behavior are normal. Patient exhibits appropriate insight and judgement.  Breast exam is performed with chaperone Pandora Leiter, RN present.  ____________________________________________   LABS (all labs ordered are listed, but only abnormal results are displayed)  Labs Reviewed - No data to display ____________________________________________  EKG   ____________________________________________  RADIOLOGY   No results found.  ____________________________________________    PROCEDURES  Procedure(s) performed:       Medications - No data to display   ____________________________________________   INITIAL IMPRESSION / ASSESSMENT AND PLAN / ED COURSE  Pertinent labs & imaging results that were available during my care of the patient were reviewed by me and considered in my medical decision making (see chart for details).  Patient's diagnosis is consistent with mass to the left breast. Well exam is reassuring with lesion being well-circumscribed and mobile patient is informed that she must follow-up with either primary care or OB/GYN for further evaluation. Lesion is most likely cystic in nature with suddenness of onset, tenderness, well-circumscribed, mobile features. Patient has no history of breast cancer and self or family members.. Pain is associated with onset of menses and patient is encouraged to take anti-inflammatories for  symptom improvement. Patient is instructed to follow-up closely for further evaluation and treatment if necessary. Patient is given ED precautions to return to the ED for any worsening or new symptoms.     ____________________________________________  FINAL CLINICAL IMPRESSION(S) / ED DIAGNOSES  Final diagnoses:  Breast mass, left      NEW MEDICATIONS STARTED DURING THIS VISIT:  New Prescriptions   No medications on file        This chart was dictated using voice recognition software/Dragon. Despite best efforts to proofread, errors can occur which can change the meaning. Any change was purely unintentional.    Racheal Patches, PA-C 06/18/15 2217  Governor Rooks, MD 06/18/15 [phone]

## 2015-06-18 NOTE — Discharge Instructions (Signed)
Breast Cyst A breast cyst is a sac in the breast that is filled with fluid. Breast cysts are common in women. Women can have one or many cysts. When the breasts contain many cysts, it is usually due to a noncancerous (benign) condition called fibrocystic change. These lumps form under the influence of female hormones (estrogen and progesterone). The lumps are most often located in the upper, outer portion of the breast. They are often more swollen, painful, and tender before your period starts. They usually disappear after menopause, unless you are on hormone therapy.  There are several types of cysts:  Macrocyst. This is a cyst that is about 2 in. (5.1 cm) in diameter.   Microcyst. This is a tiny cyst that you cannot feel but can be seen with a mammogram or an ultrasound.   Galactocele. This is a cyst containing milk that may develop if you suddenly stop breastfeeding.   Sebaceous cyst of the skin. This type of cyst is not in the breast tissue itself. Breast cysts do not increase your risk of breast cancer. However, they must be monitored closely because they can be cancerous.  CAUSES  It is not known exactly what causes a breast cyst to form. Possible causes include:  An overgrowth of milk glands and connective tissue in the breast can block the milk glands, causing them to fill with fluid.   Scar tissue in the breast from previous surgery may block the glands, causing a cyst.  RISK FACTORS Estrogen may influence the development of a breast cyst.  SIGNS AND SYMPTOMS   Feeling a smooth, round, soft lump (like a grape) in the breast that is easily moveable.   Breast discomfort or pain.  Increase in size of the lump before your menstrual period and decrease in its size after your menstrual period.  DIAGNOSIS  A cyst can be felt during a physical exam by your health care provider. A breast X-ray exam (mammogram) and ultrasonography will be done to confirm the diagnosis. Fluid may  be removed from the cyst with a needle (fine needle aspiration) to make sure the cyst is not cancerous.  TREATMENT  Treatment may not be necessary. Your health care provider may monitor the cyst to see if it goes away on its own. If treatment is needed, it may include:  Hormone treatment.   Needle aspiration. There is a chance of the cyst coming back after aspiration.   Surgery to remove the whole cyst.  HOME CARE INSTRUCTIONS   Keep all follow-up appointments with your health care provider.  See your health care provider regularly:  Get a yearly exam by your health care provider.  Have a clinical breast exam by a health care provider every 1-3 years if you are 20-40 years of age. After age 40 years, you should have the exam every year.   Get mammogram tests as directed by your health care provider.   Understand the normal appearance and feel of your breasts and perform breast self-exams.   Only take over-the-counter or prescription medicines as directed by your health care provider.   Wear a supportive bra, especially when exercising.   Avoid caffeine.   Reduce your salt intake, especially before your menstrual period. Too much salt can cause fluid retention, breast swelling, and discomfort.  SEEK MEDICAL CARE IF:   You feel, or think you feel, a lump in your breast.   You notice that both breasts look or feel different than usual.   Your   breast is still causing pain after your menstrual period is over.   You need medicine for breast pain and swelling that occurs with your menstrual period.  SEEK IMMEDIATE MEDICAL CARE IF:   You have severe pain, tenderness, redness, or warmth in your breast.   You have nipple discharge or bleeding.   Your breast lump becomes hard and painful.   You find new lumps or bumps that were not there before.   You feel lumps in your armpit (axilla).   You notice dimpling or wrinkling of the breast or nipple.   You  have a fever.  MAKE SURE YOU:  Understand these instructions.  Will watch your condition.  Will get help right away if you are not doing well or get worse.   This information is not intended to replace advice given to you by your health care provider. Make sure you discuss any questions you have with your health care provider.   Document Released: 02/04/2005 Document Revised: 10/07/2012 Document Reviewed: 09/03/2012 [employer] Interactive Patient Education 2016 [employer]  

## 2015-06-18 NOTE — ED Notes (Signed)
Patient reports "knot" to left breast area.  States noticed it yesterday and area is tender and painful to touch.

## 2015-06-18 NOTE — ED Notes (Signed)
At bedside with PA for breast exam
